# Patient Record
Sex: Male | Born: 1980 | Hispanic: Yes | Marital: Married | Smoking: Never smoker
Health system: Southern US, Community
[De-identification: ages and names within clinical notes are randomized; demographics above are authoritative.]

---

## 2013-08-20 ENCOUNTER — Emergency Department: Payer: Self-pay | Admitting: Emergency Medicine

## 2013-08-22 ENCOUNTER — Emergency Department: Payer: Self-pay | Admitting: Emergency Medicine

## 2015-07-15 IMAGING — CT CT OF THE LEFT ELBOW WITHOUT CONTRAST
4 of 8 series · 14 of 33 positions shown, 15 images · non-contrast
Comparison: X-rays earlier today

CLINICAL DATA: Elbow pain after fall. Question of metaphysis seal
fracture in the humerus on x-ray.

EXAM:
CT OF THE LEFT ELBOW WITHOUT CONTRAST
TECHNIQUE: Multidetector CT imaging was performed according to the standard
protocol. Multiplanar CT image reconstructions were also generated.

[Series 5: elbow soft (id) · axial · 0.36mm/px · z∈[-264,-180]mm · 4 of 70 slices shown]
[im 14/70  soft-tissue]
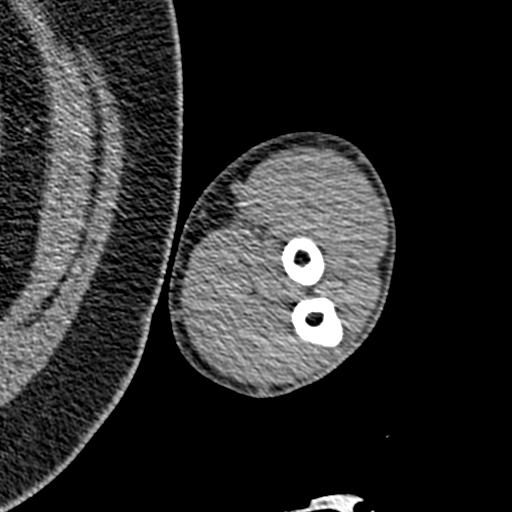
[im 28/70  soft-tissue]
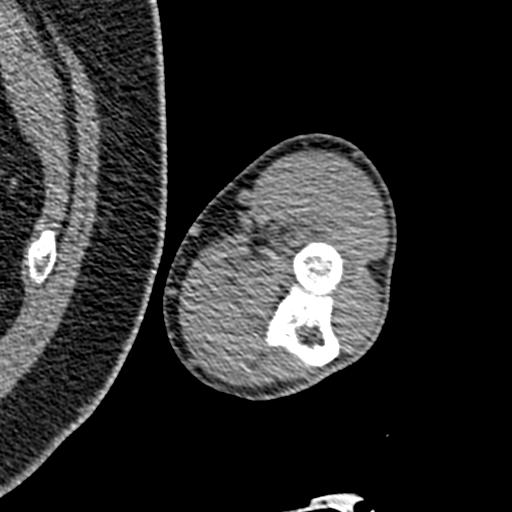
[im 42/70  soft-tissue]
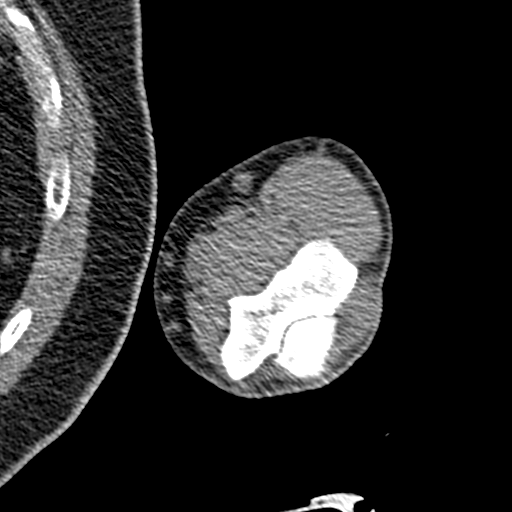
[im 56/70  soft-tissue]
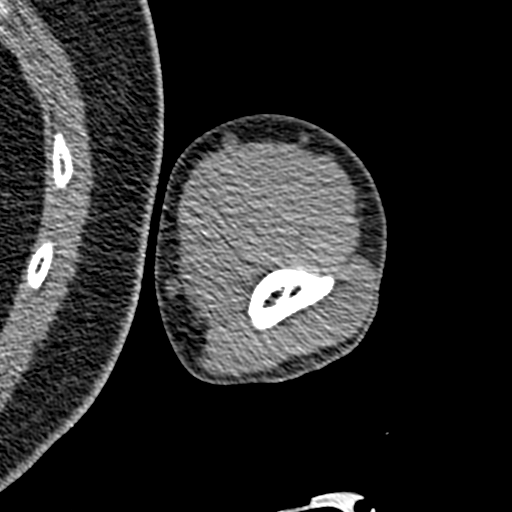

[Series 602: axial st · axial · 0.36mm/px · z∈[-277,-188]mm · 3 of 46 slices shown, 4 images]
[im 1/46  soft-tissue]
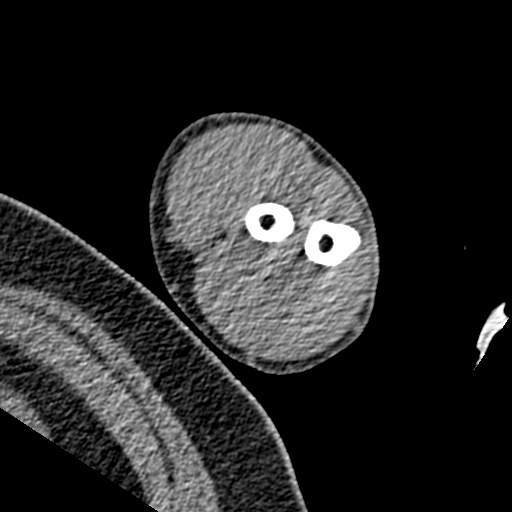
[im 1/46  bone]
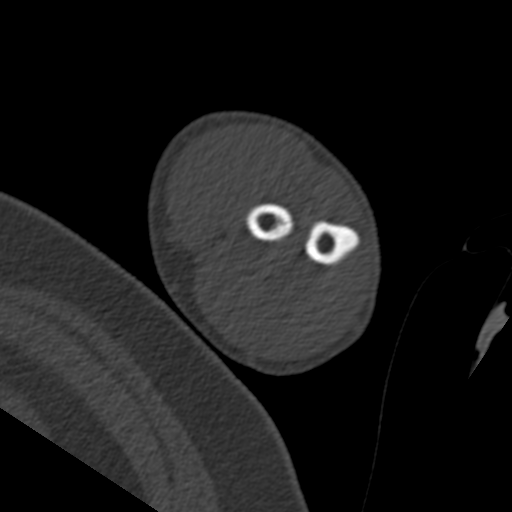
[im 23/46  bone]
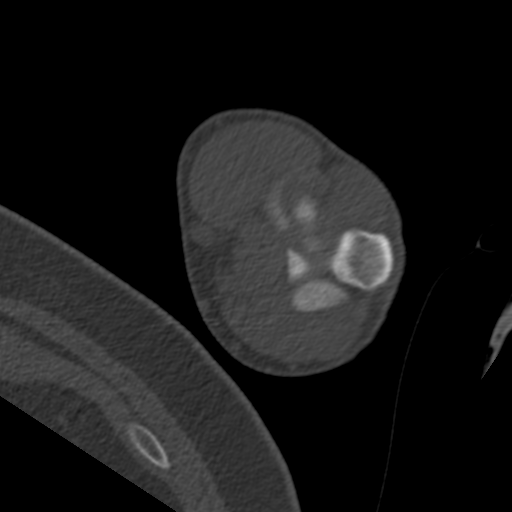
[im 46/46  bone]
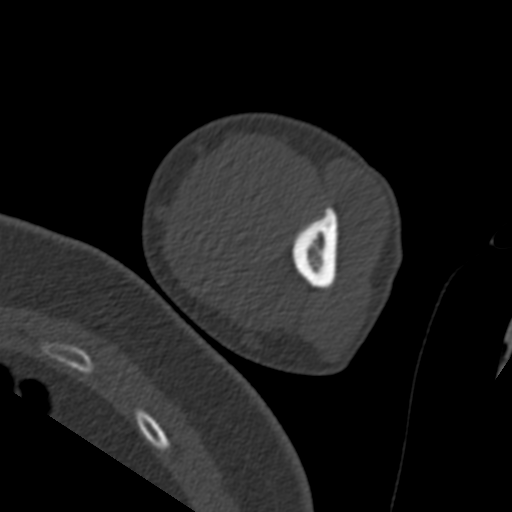

[Series 603: cor st · coronal · 0.36mm/px · 2 of 41 slices shown]
[im 14/41  bone]
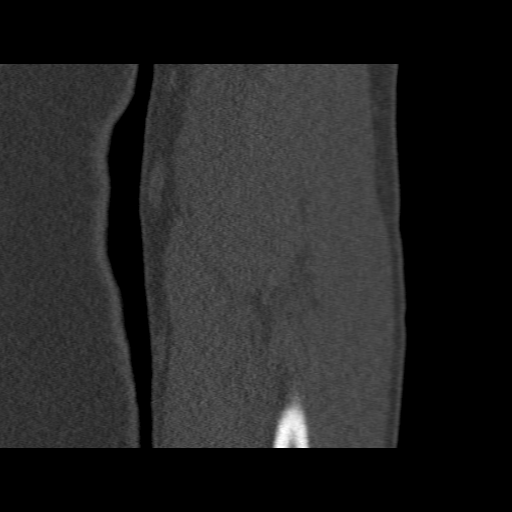
[im 27/41  bone]
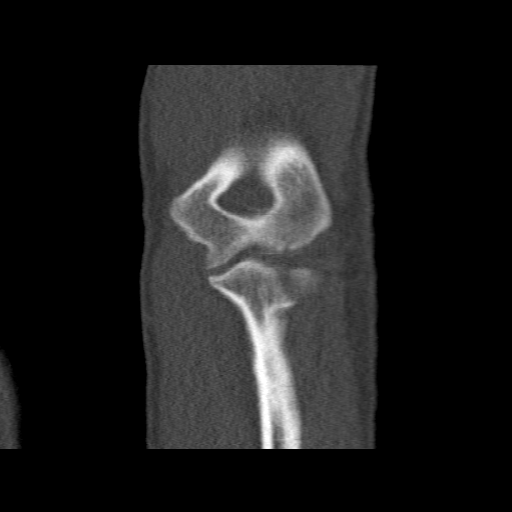

[Series 604: sag st · sagittal · 0.36mm/px · 5 of 49 slices shown]
[im 7/49  bone]
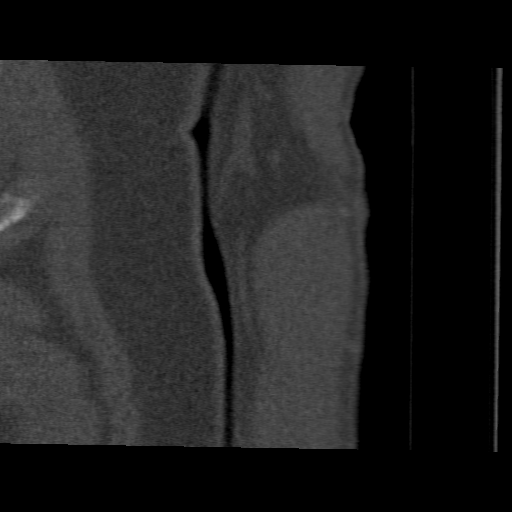
[im 14/49  bone]
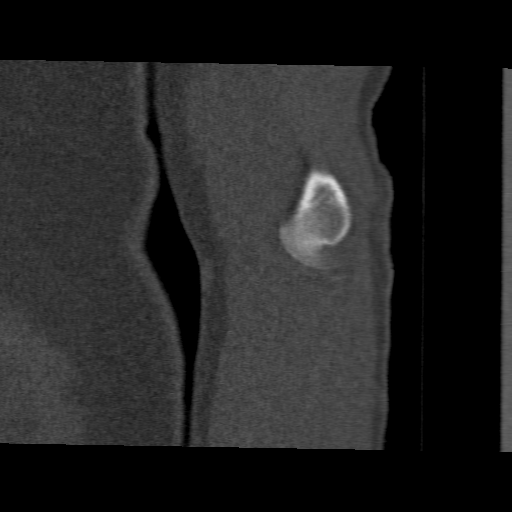
[im 21/49  bone]
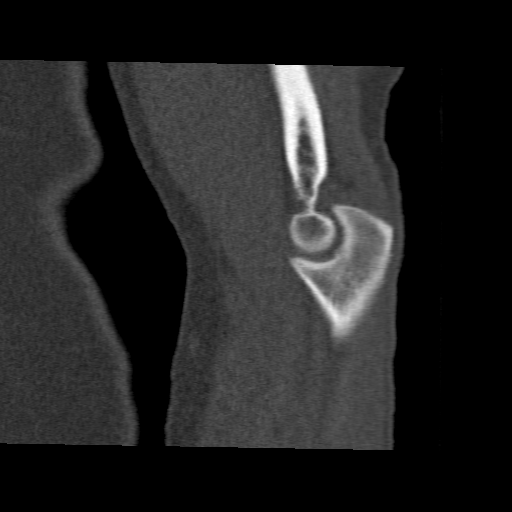
[im 28/49  bone]
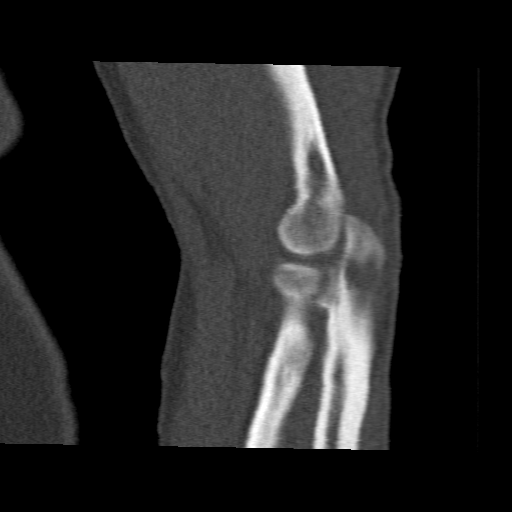
[im 35/49  bone]
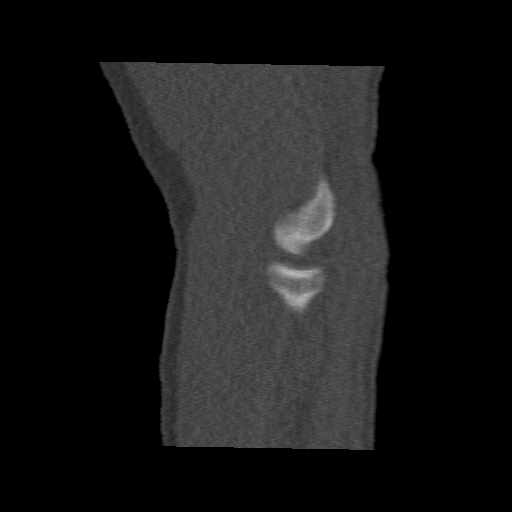

[14 of 33 positions shown; findings below may reference images not displayed]

FINDINGS: Scanning through the elbow shows no evidence for fracture in the
distal humerus or in the proximal aspect of the radius or ulna. No
CT findings to suggest the apparent cortical defect seen on the
lateral x-ray earlier. Fine bony detail is not well seen secondary
low dose technique employed for this exam. No gross joint effusion
is evident.
IMPRESSION: No gross fracture of the distal humerus evident by CT. Cortical
resolution is limited by low dose technique. If patient's symptoms
persist or worsen, MRI could be used to assess for marrow edema or
soft tissue injury.

## 2015-07-15 IMAGING — CR DG ELBOW COMPLETE 3+V*L*
1 series · 4 of 4 positions shown · non-contrast
Comparison: None.

CLINICAL DATA: Pain post trauma

EXAM:
LEFT ELBOW - COMPLETE 3+ VIEW

[Series 1: lat · 0.17mm/px · 4 of 4 slices shown]
[im 1/4]
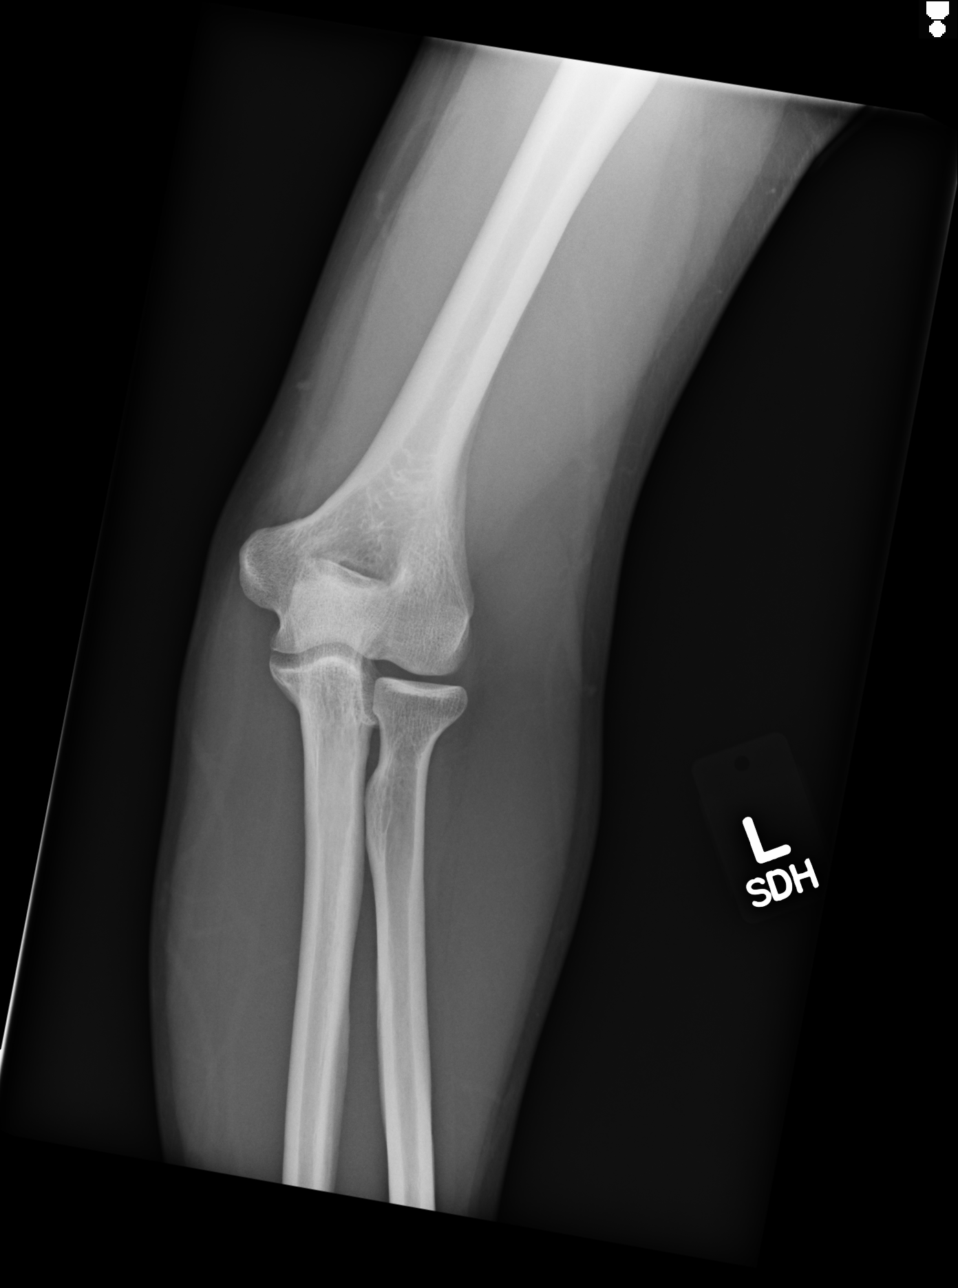
[im 2/4]
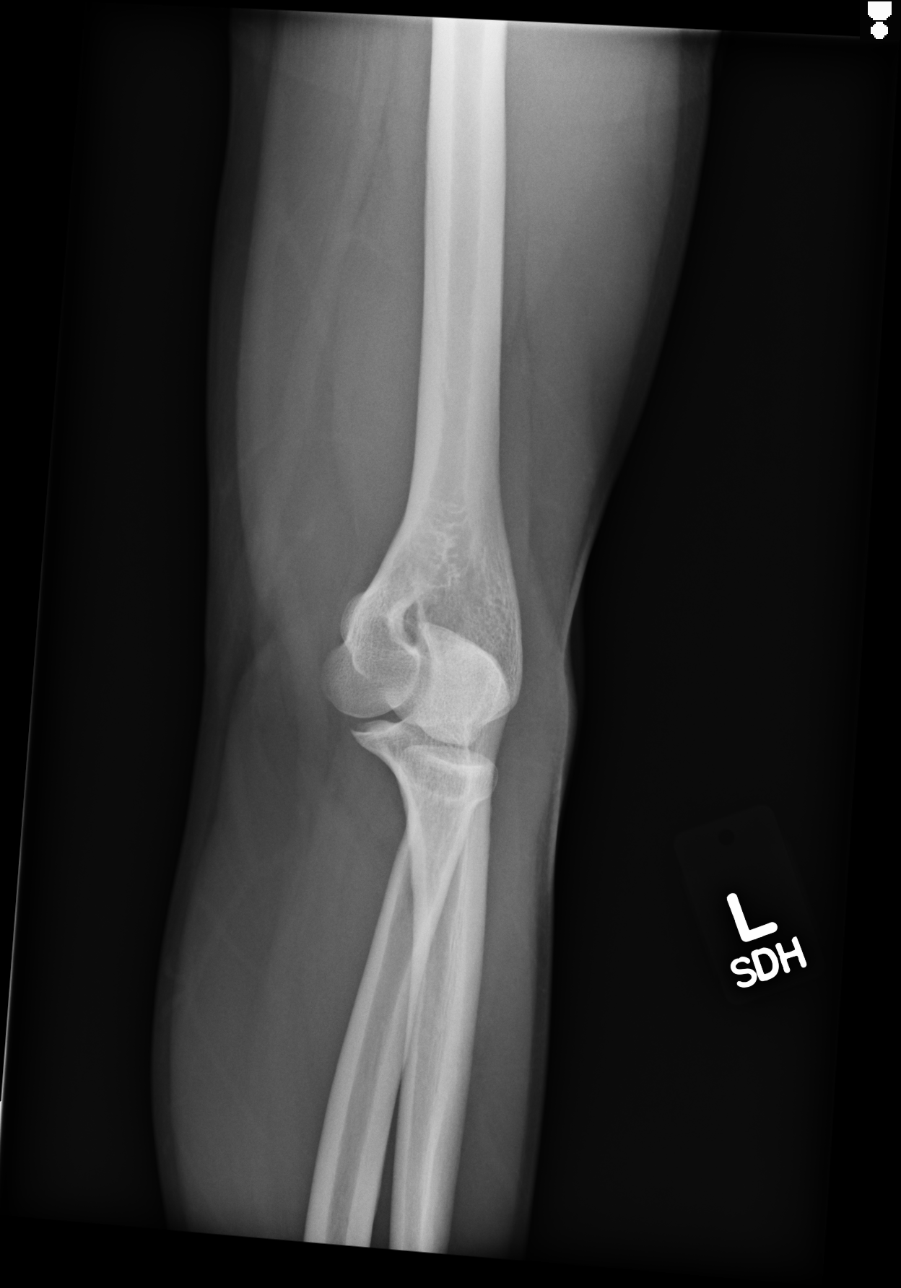
[im 3/4]
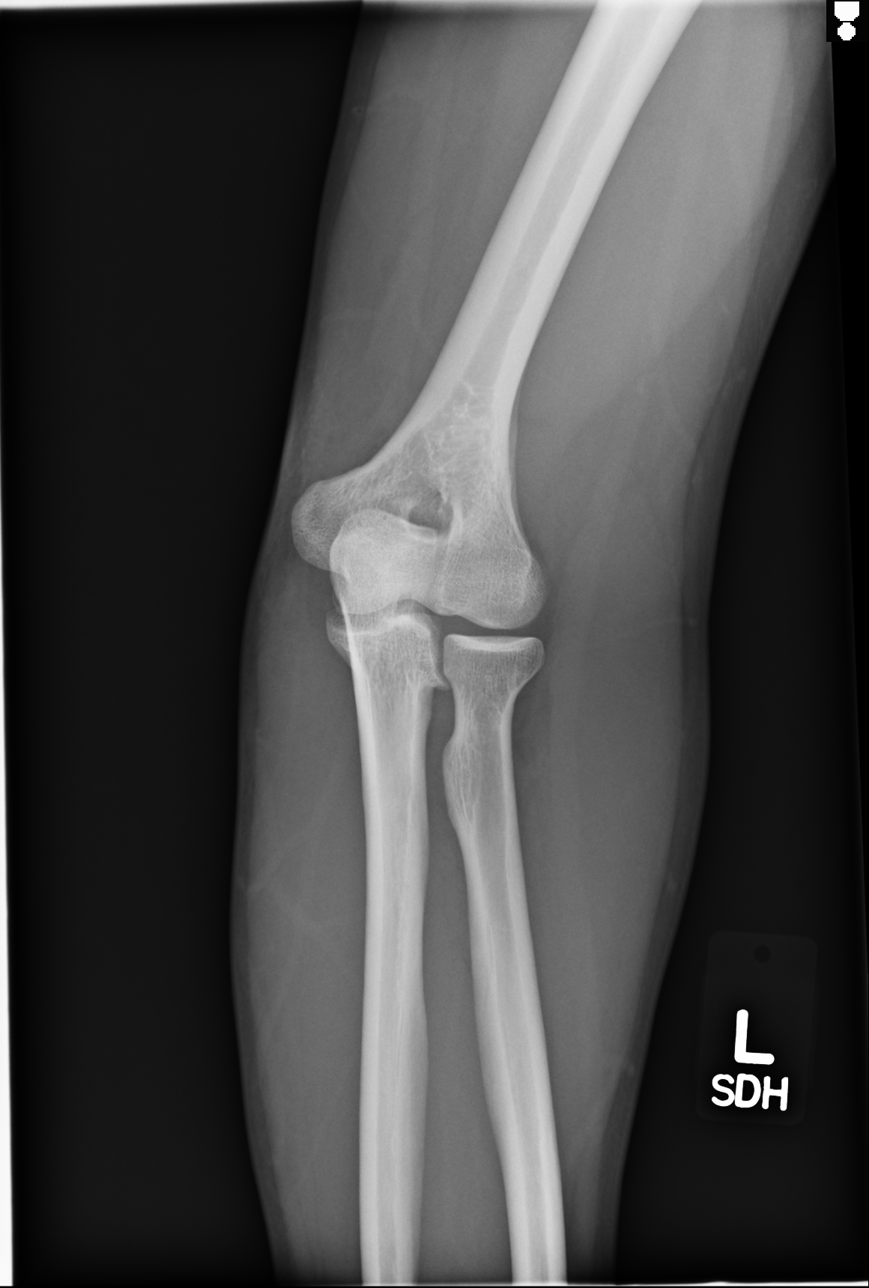
[im 4/4]
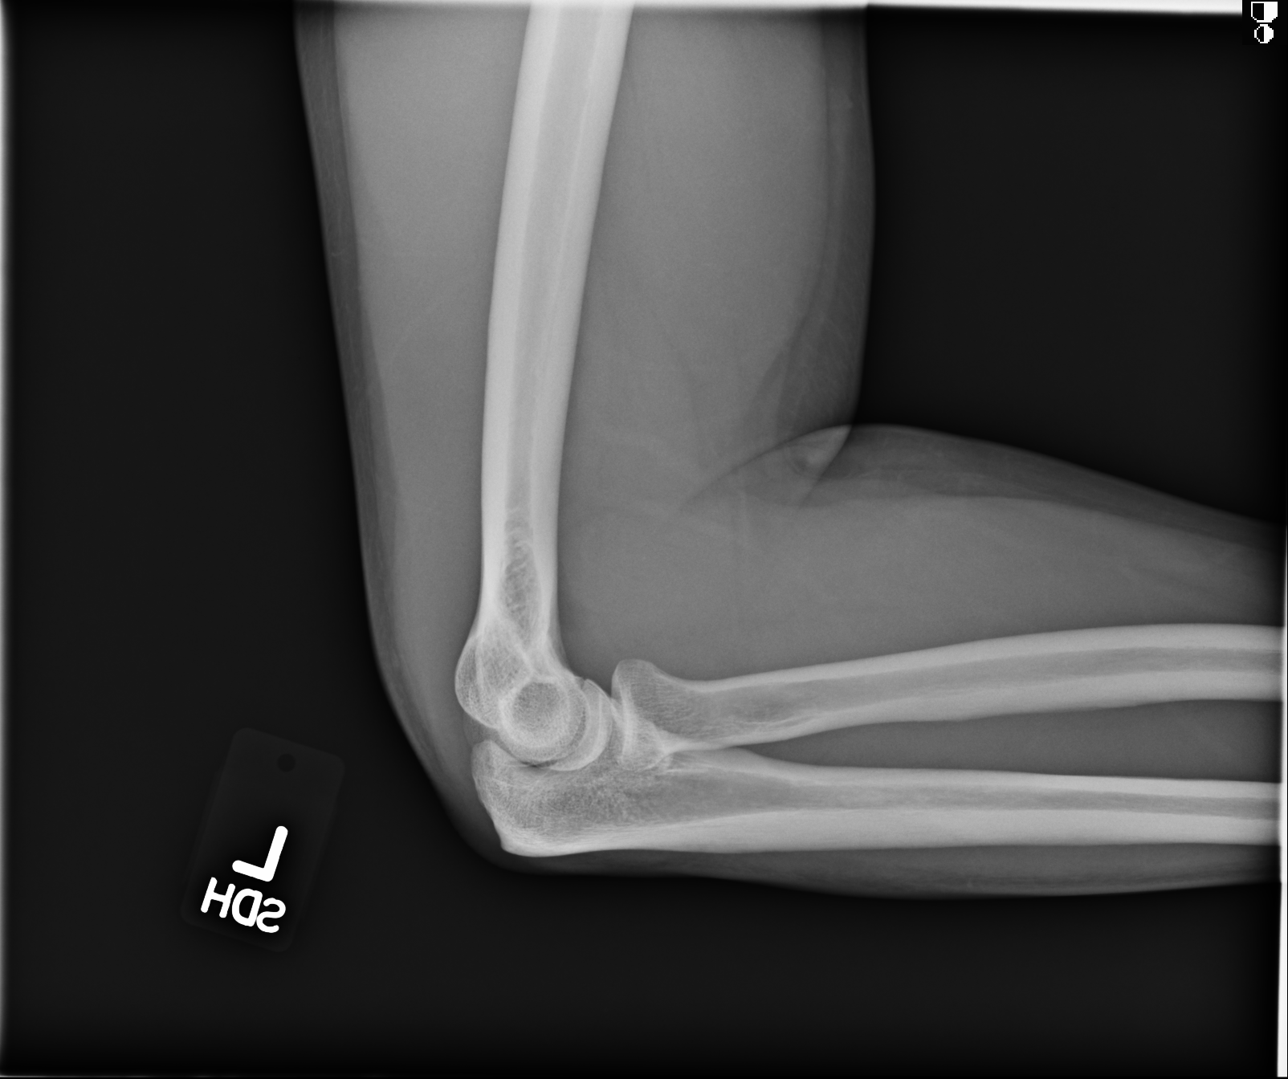

[4 of 4 positions shown; findings below may reference images not displayed]

FINDINGS: Frontal, lateral, and bilateral oblique views were obtained. There
is a nondisplaced fracture of the distal humeral metaphysis along
the volar aspect, best seen the lateral view. There is no other
apparent fracture. No dislocation. There is a joint effusion.
IMPRESSION: Incomplete fracture of the distal humeral metaphysis along the volar
aspect, seen only on the lateral view. Joint effusion present. No
dislocation.

## 2016-01-24 ENCOUNTER — Encounter: Payer: Self-pay | Admitting: Emergency Medicine

## 2016-01-24 ENCOUNTER — Emergency Department
Admission: EM | Admit: 2016-01-24 | Discharge: 2016-01-24 | Disposition: A | Discharge status: Home / Self Care | Payer: Self-pay | Attending: Emergency Medicine | Admitting: Emergency Medicine

## 2016-01-24 DIAGNOSIS — E1165 Type 2 diabetes mellitus with hyperglycemia: Secondary | ICD-10-CM | POA: Insufficient documentation

## 2016-01-24 DIAGNOSIS — H538 Other visual disturbances: Secondary | ICD-10-CM

## 2016-01-24 LAB — LIPID PANEL
CHOLESTEROL: 263 mg/dL — AB (ref 0–200)
LDL Cholesterol: UNDETERMINED mg/dL (ref 0–99)
Triglycerides: 1413 mg/dL — ABNORMAL HIGH (ref <150)
VLDL: UNDETERMINED mg/dL (ref 0–40)

## 2016-01-24 LAB — COMPREHENSIVE METABOLIC PANEL
ALT: 126 U/L — ABNORMAL HIGH (ref 17–63)
AST: 77 U/L — ABNORMAL HIGH (ref 15–41)
Albumin: 4.6 g/dL (ref 3.5–5.0)
Alkaline Phosphatase: 179 U/L — ABNORMAL HIGH (ref 38–126)
Anion gap: 13 (ref 5–15)
BUN: 13 mg/dL (ref 6–20)
CHLORIDE: 95 mmol/L — AB (ref 101–111)
CO2: 23 mmol/L (ref 22–32)
CREATININE: 0.82 mg/dL (ref 0.61–1.24)
Calcium: 9.5 mg/dL (ref 8.9–10.3)
Glucose, Bld: 429 mg/dL — ABNORMAL HIGH (ref 65–99)
POTASSIUM: 3.8 mmol/L (ref 3.5–5.1)
Sodium: 131 mmol/L — ABNORMAL LOW (ref 135–145)
Total Bilirubin: 0.5 mg/dL (ref 0.3–1.2)
Total Protein: 8.2 g/dL — ABNORMAL HIGH (ref 6.5–8.1)

## 2016-01-24 LAB — CBC
HCT: 45.2 % (ref 40.0–52.0)
HEMOGLOBIN: 15.5 g/dL (ref 13.0–18.0)
MCH: 29.2 pg (ref 26.0–34.0)
MCHC: 34.2 g/dL (ref 32.0–36.0)
MCV: 85.3 fL (ref 80.0–100.0)
Platelets: 168 10*3/uL (ref 150–440)
RBC: 5.3 MIL/uL (ref 4.40–5.90)
RDW: 13.2 % (ref 11.5–14.5)
WBC: 11.6 10*3/uL — ABNORMAL HIGH (ref 3.8–10.6)

## 2016-01-24 LAB — URINALYSIS, COMPLETE (UACMP) WITH MICROSCOPIC
Bacteria, UA: NONE SEEN
Bilirubin Urine: NEGATIVE
Glucose, UA: >=500 mg/dL — AB
Hgb urine dipstick: NEGATIVE
Ketones, ur: 20 mg/dL — AB
Leukocytes, UA: NEGATIVE
Nitrite: NEGATIVE
Protein, ur: 30 mg/dL — AB
Specific Gravity, Urine: 1.038 — ABNORMAL HIGH (ref 1.005–1.030)
pH: 6 (ref 5.0–8.0)

## 2016-01-24 LAB — LIPASE, BLOOD: LIPASE: 24 U/L (ref 11–51)

## 2016-01-24 LAB — GLUCOSE, CAPILLARY
GLUCOSE-CAPILLARY: 382 mg/dL — AB (ref 65–99)
GLUCOSE-CAPILLARY: 429 mg/dL — AB (ref 65–99)
Glucose-Capillary: 387 mg/dL — ABNORMAL HIGH (ref 65–99)

## 2016-01-24 MED ORDER — METFORMIN HCL 500 MG PO TABS: 500.0000 mg | ORAL_TABLET | Freq: Two times a day (BID) | ORAL | 5 refills | Status: AC

## 2016-01-24 MED ORDER — SODIUM CHLORIDE 0.9 % IV BOLUS (SEPSIS)
1000.0000 mL | INTRAVENOUS | Status: AC
  Administered 2016-01-24: 1000 mL via INTRAVENOUS

## 2016-01-24 MED ORDER — ONDANSETRON 4 MG PO TBDP: ORAL_TABLET | ORAL | 0 refills | Status: AC

## 2016-01-24 NOTE — ED Provider Notes (Signed)
[facility] Emergency Department Provider Note  ____________________________________________   First MD Initiated Contact with Patient 01/24/16 2042     (approximate)  I have reviewed the triage vital signs and the nursing notes.   HISTORY  Chief Complaint Polydipsia and Urinary Frequency  The patient is a native Spanish speaking but speaks English and declines the use of an interpreter  HPI Hector Hall is a 35 y.o. male with no known chronic medical history who presents for evaluation of increasing urinary frequency, increased thirst, and blurry vision.  These symptoms have been gradual in onset over the last 2 weeks, and were not present before that time.  Denies fever/chills, chest pain, shortness of breath, headache, dizziness, nausea, vomiting, diarrhea, dysuria.  States that the urinary frequency and thirst are severe, and nothing makes it better.  Blurry vision comes and goes but is severe at worst and mild at best.  Has an appointment with new PCP in about 2.5 weeks, but felt he could not wait.   History reviewed. No pertinent past medical history.  There are no active problems to display for this patient.   History reviewed. No pertinent surgical history.  Prior to Admission medications   Medication Sig Start Date End Date Taking? Authorizing Provider  metFORMIN (GLUCOPHAGE) 500 MG tablet Take 1 tablet (500 mg total) by mouth 2 (two) times daily with a meal. 01/24/16 01/23/17  Loleta Roseory Kavita Bartl, MD  ondansetron (ZOFRAN ODT) 4 MG disintegrating tablet Allow 1-2 tablets to dissolve in your mouth every 8 hours as needed for nausea/vomiting 01/24/16   Loleta Roseory Rosselyn Martha, MD    Allergies Patient has no known allergies.  No family history on file.  Social History Social History  Substance Use Topics  . Smoking status: Never Smoker  . Smokeless tobacco: Never Used  . Alcohol use Yes    Review of Systems Constitutional: No  fever/chills Eyes: Bilateral blurry vision. ENT: No sore throat. Cardiovascular: Denies chest pain. Respiratory: Denies shortness of breath. Gastrointestinal: No abdominal pain.  No nausea, no vomiting.  No diarrhea.  No constipation. Genitourinary: Negative for dysuria.  +Polyuria and +polydypsia Musculoskeletal: Negative for back pain. Skin: Negative for rash. Neurological: Negative for headaches, focal weakness or numbness.  10-point ROS otherwise negative.  ____________________________________________   PHYSICAL EXAM:  VITAL SIGNS: ED Triage Vitals [01/24/16 1847]  Enc Vitals Group     BP (!) 146/90     Pulse Rate 90     Resp 18     Temp 97.9 F (36.6 C)     Temp Source Oral     SpO2 100 %     Weight 224 lb (101.6 kg)     Height 5\' 10"  (1.778 m)     Head Circumference      Peak Flow      Pain Score      Pain Loc      Pain Edu?      Excl. in GC?     Constitutional: Alert and oriented. Well appearing and in no acute distress. Eyes: Conjunctivae are normal. PERRL. EOMI. Head: Atraumatic. Nose: No congestion/rhinnorhea. Mouth/Throat: Mucous membranes are moist.  Oropharynx non-erythematous. Neck: No stridor.  No meningeal signs.   Cardiovascular: Normal rate, regular rhythm. Good peripheral circulation. Grossly normal heart sounds. Respiratory: Normal respiratory effort.  No retractions. Lungs CTAB. Gastrointestinal: Soft and nontender. No distention.  Musculoskeletal: No lower extremity tenderness nor edema. No gross deformities of extremities. Neurologic:  Normal speech and language. No  gross focal neurologic deficits are appreciated.  Skin:  Skin is warm, dry and intact. No rash noted. Psychiatric: Mood and affect are normal. Speech and behavior are normal.  ____________________________________________   LABS (all labs ordered are listed, but only abnormal results are displayed)  Labs Reviewed  CBC - Abnormal; Notable for the following:       Result Value    WBC 11.6 (*)    All other components within normal limits  URINALYSIS, COMPLETE (UACMP) WITH MICROSCOPIC - Abnormal; Notable for the following:    Color, Urine YELLOW (*)    APPearance CLEAR (*)    Specific Gravity, Urine 1.038 (*)    Glucose, UA >=500 (*)    Ketones, ur 20 (*)    Protein, ur 30 (*)    Squamous Epithelial / LPF 0-5 (*)    All other components within normal limits  GLUCOSE, CAPILLARY - Abnormal; Notable for the following:    Glucose-Capillary 429 (*)    All other components within normal limits  GLUCOSE, CAPILLARY - Abnormal; Notable for the following:    Glucose-Capillary 382 (*)    All other components within normal limits  COMPREHENSIVE METABOLIC PANEL - Abnormal; Notable for the following:    Sodium 131 (*)    Chloride 95 (*)    Glucose, Bld 429 (*)    Total Protein 8.2 (*)    AST 77 (*)    ALT 126 (*)    Alkaline Phosphatase 179 (*)    All other components within normal limits  LIPASE, BLOOD  HEMOGLOBIN A1C  LIPID PANEL  CBG MONITORING, ED   ____________________________________________  EKG  None - EKG not ordered by ED physician ____________________________________________  RADIOLOGY   No results found.  ____________________________________________   PROCEDURES  Procedure(s) performed:   Procedures   Critical Care performed: No ____________________________________________   INITIAL IMPRESSION / ASSESSMENT AND PLAN / ED COURSE  Pertinent labs & imaging results that were available during my care of the patient were reviewed by me and considered in my medical decision making (see chart for details).  The patient almost certainly has a new diagnosis of type 2 diabetes based on his lab work and history.  Fortunately his lab work is otherwise unremarkable with no evidence of DKA or significant electrolyte abnormality.  I provided IV fluids and had a long discussion with him and his wife about starting metformin and the need for close  outpatient follow-up.  I sent a hemoglobin A1c and lipid panel to help complete the workup so that his regular doctor would be able to obtain the results.  I also strongly encouraged him to go to the ophthalmologist tomorrow or the next available appointment for further evaluation given his blurry vision and the need for more extensive specialist workup.  He and wife understand and agree with the plan.  I gave my usual and customary return precautions.    ____________________________________________  FINAL CLINICAL IMPRESSION(S) / ED DIAGNOSES  Final diagnoses:  Type 2 diabetes mellitus with hyperglycemia, without long-term current use of insulin (HCC)  Blurry vision, bilateral     MEDICATIONS GIVEN DURING THIS VISIT:  Medications  sodium chloride 0.9 % bolus 1,000 mL (1,000 mLs Intravenous New Bag/Given 01/24/16 2004)     NEW OUTPATIENT MEDICATIONS STARTED DURING THIS VISIT:  New Prescriptions   METFORMIN (GLUCOPHAGE) 500 MG TABLET    Take 1 tablet (500 mg total) by mouth 2 (two) times daily with a meal.   ONDANSETRON (ZOFRAN ODT) 4  MG DISINTEGRATING TABLET    Allow 1-2 tablets to dissolve in your mouth every 8 hours as needed for nausea/vomiting    Modified Medications   No medications on file    Discontinued Medications   No medications on file     Note:  This document was prepared using Dragon voice recognition software and may include unintentional dictation errors.    Loleta Roseory Jodell Weitman, MD 01/24/16 2121

## 2016-01-24 NOTE — Discharge Instructions (Signed)
As we discussed, you have additional labs pending in the emergency department to confirm the diagnosis (hemoglobin A1c and lipid panel), but your lab work and history strongly suggested that you have a new diagnosis of 2 diabetes.  Please read through the included information about diabetes, food, as her size, and avoiding complications.  Start taking the medication prescribed (metformin) and only use the nausea medicine if needed.  Follow up with ophthalmology tomorrow for the next available appointment for an exam to help further assess your vision.  Please see your regular doctor as scheduled or earlier if they can work you in sooner.    Return to the emergency department if you develop new or worsening symptoms that concern you.

## 2016-01-24 NOTE — ED Triage Notes (Signed)
Pt presents with increased thirst and frequent urination started back on 12/13. [facility] was [facility] and sent over for further eval. [facility]. No history of DM.

## 2016-01-26 LAB — HEMOGLOBIN A1C
Hgb A1c MFr Bld: 11.8 % — ABNORMAL HIGH (ref 4.8–5.6)
MEAN PLASMA GLUCOSE: 292 mg/dL

## 2019-03-07 ENCOUNTER — Ambulatory Visit: Payer: Self-pay

## 2019-03-07 ENCOUNTER — Ambulatory Visit: Payer: Self-pay | Attending: Internal Medicine

## 2019-03-07 DIAGNOSIS — Z20822 Contact with and (suspected) exposure to covid-19: Secondary | ICD-10-CM | POA: Insufficient documentation

## 2019-03-08 LAB — NOVEL CORONAVIRUS, NAA: SARS-CoV-2, NAA: NOT DETECTED

## 2023-04-06 ENCOUNTER — Other Ambulatory Visit: Payer: Self-pay | Admitting: Nurse Practitioner

## 2023-04-06 DIAGNOSIS — R748 Abnormal levels of other serum enzymes: Secondary | ICD-10-CM

## 2023-04-06 DIAGNOSIS — E1165 Type 2 diabetes mellitus with hyperglycemia: Secondary | ICD-10-CM

## 2023-04-16 ENCOUNTER — Ambulatory Visit
Admission: RE | Admit: 2023-04-16 | Discharge: 2023-04-16 | Disposition: A | Discharge status: Home / Self Care | Payer: Self-pay | Source: Ambulatory Visit | Attending: Nurse Practitioner | Admitting: Nurse Practitioner

## 2023-04-16 DIAGNOSIS — R748 Abnormal levels of other serum enzymes: Secondary | ICD-10-CM | POA: Diagnosis present

## 2023-04-16 DIAGNOSIS — E1165 Type 2 diabetes mellitus with hyperglycemia: Secondary | ICD-10-CM | POA: Diagnosis present
